# Patient Record
Sex: Female | Born: 1980 | Race: Black or African American | Hispanic: No | Marital: Single | Smoking: Never smoker
Health system: Southern US, Community
[De-identification: ages and names within clinical notes are randomized; demographics above are authoritative.]

---

## 2004-11-30 ENCOUNTER — Other Ambulatory Visit: Admission: RE | Admit: 2004-11-30 | Discharge: 2004-11-30 | Payer: Self-pay | Admitting: Obstetrics and Gynecology

## 2008-05-02 ENCOUNTER — Emergency Department (HOSPITAL_COMMUNITY): Admission: EM | Admit: 2008-05-02 | Discharge: 2008-05-02 | Payer: Self-pay | Admitting: Emergency Medicine

## 2008-12-05 ENCOUNTER — Encounter: Admission: RE | Admit: 2008-12-05 | Discharge: 2009-01-03 | Payer: Self-pay | Admitting: Obstetrics and Gynecology

## 2008-12-26 ENCOUNTER — Encounter: Admission: RE | Admit: 2008-12-26 | Discharge: 2008-12-26 | Payer: Self-pay | Admitting: Endocrinology

## 2009-08-04 IMAGING — US US SOFT TISSUE HEAD/NECK
1 series · 14 of 25 positions shown · non-contrast
Comparison: None

CLINICAL DATA: Enlarged thyroid on physical exam

THYROID ULTRASOUND
TECHNIQUE: Ultrasound examination of the thyroid gland and
adjacent soft tissues was performed.

[Series 1: us soft tissue head/neck · 0.11mm/px · 14 of 37 slices shown]
[im 1/37]
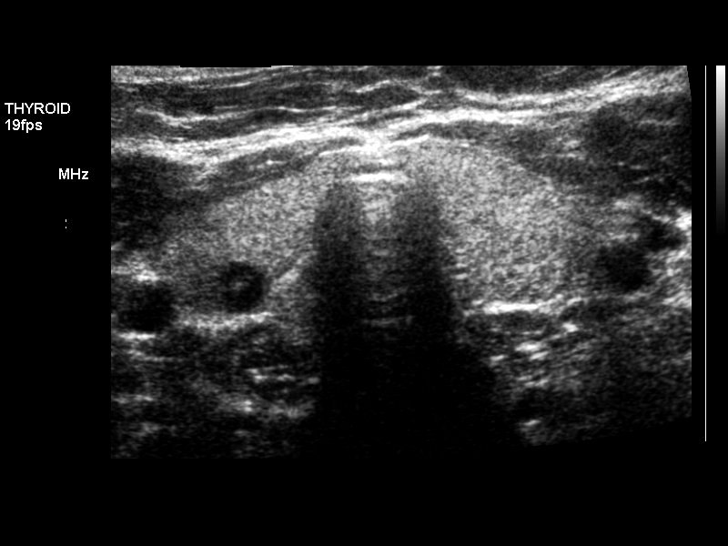
[im 4/37]
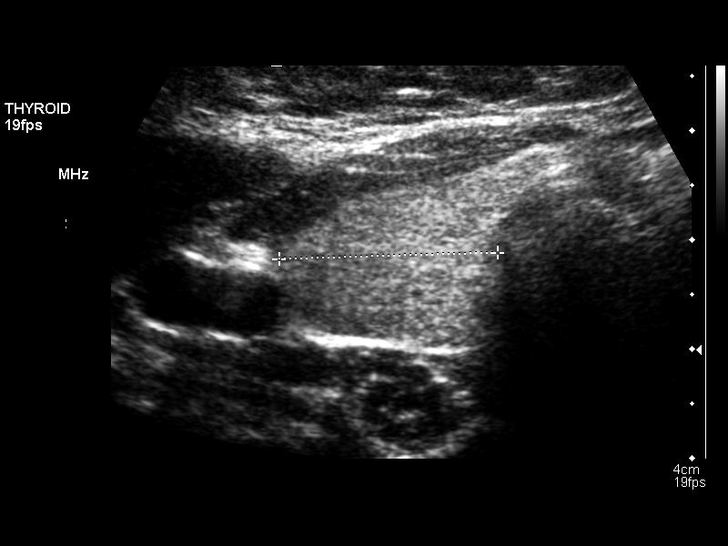
[im 7/37]
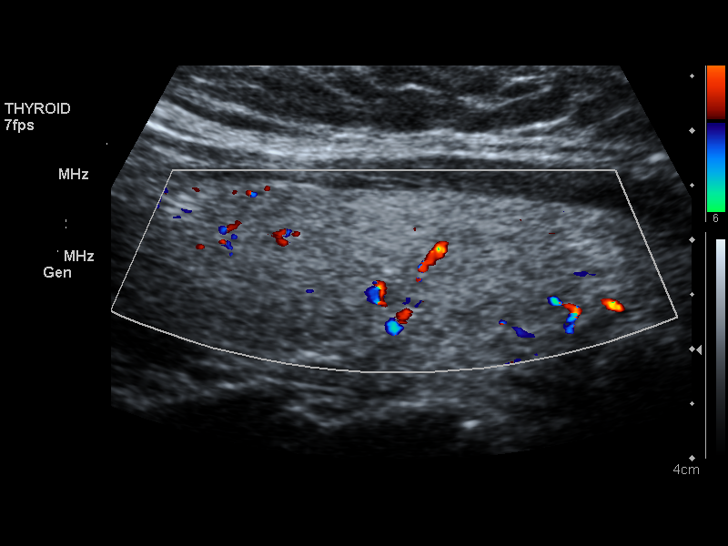
[im 10/37]
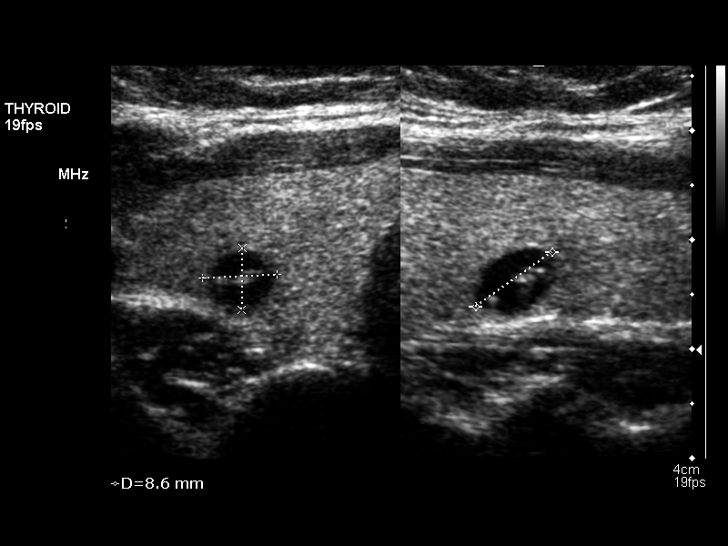
[im 13/37]
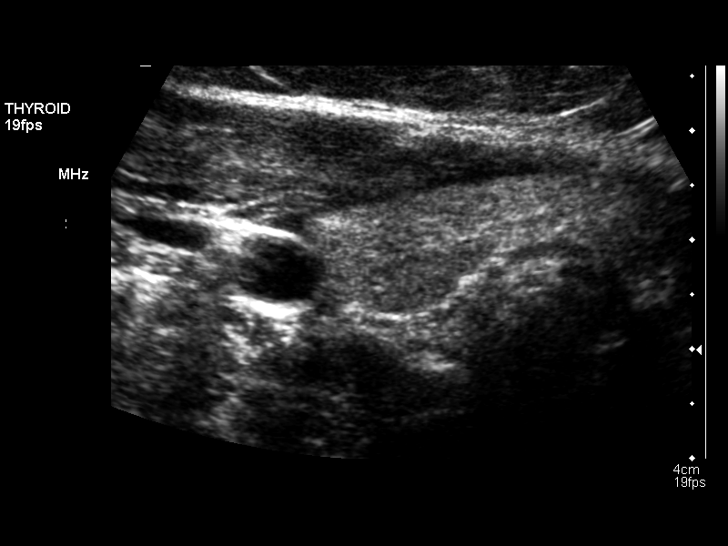
[im 14/37]
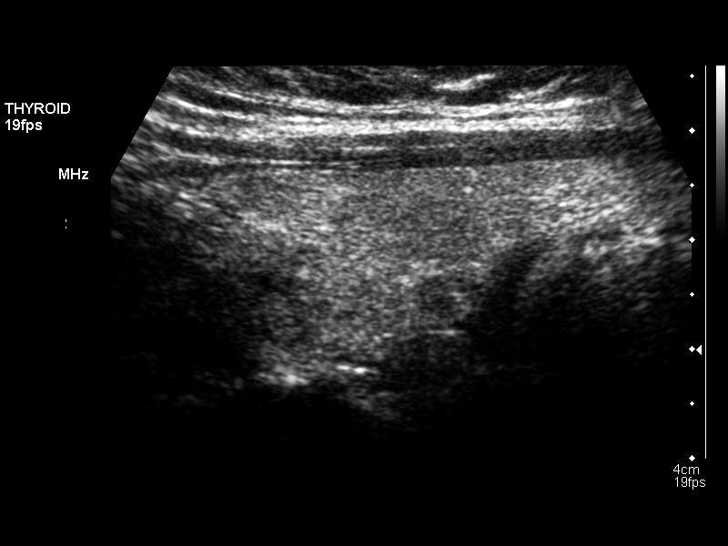
[im 17/37]
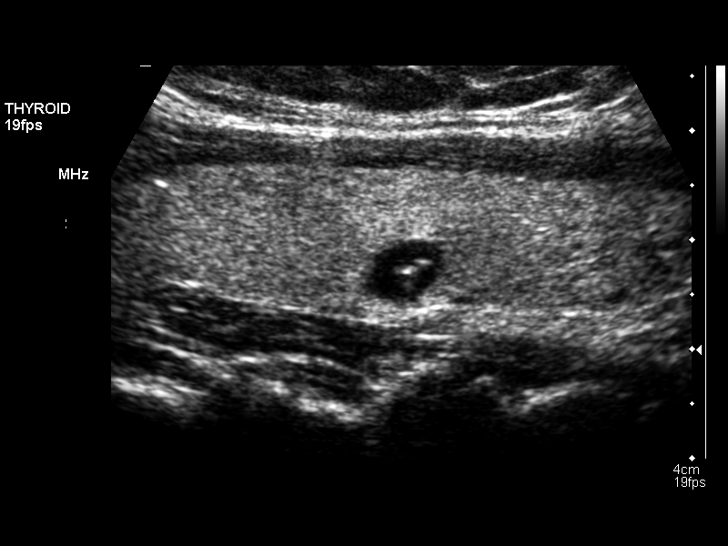
[im 20/37]
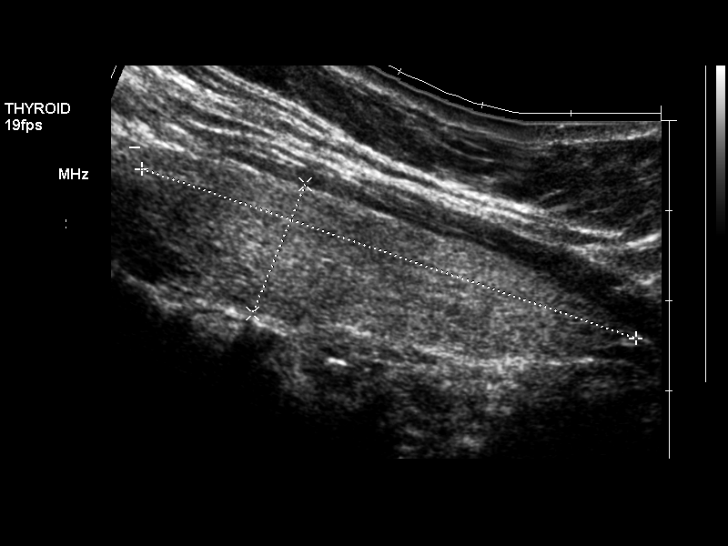
[im 23/37]
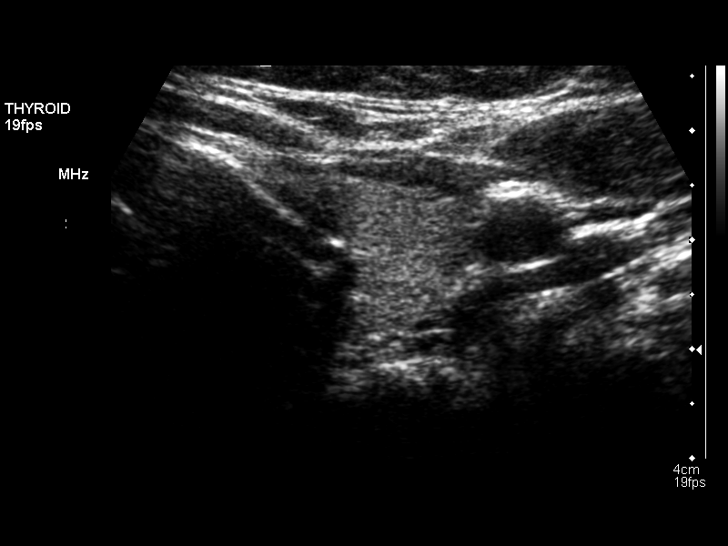
[im 25/37]
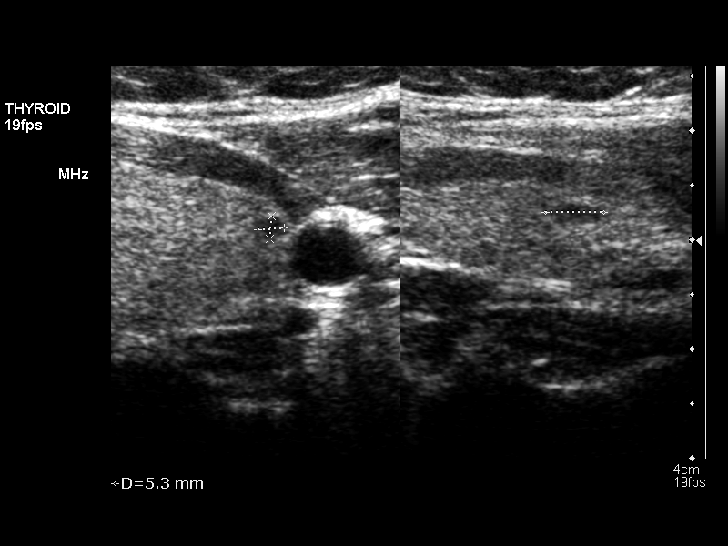
[im 28/37]
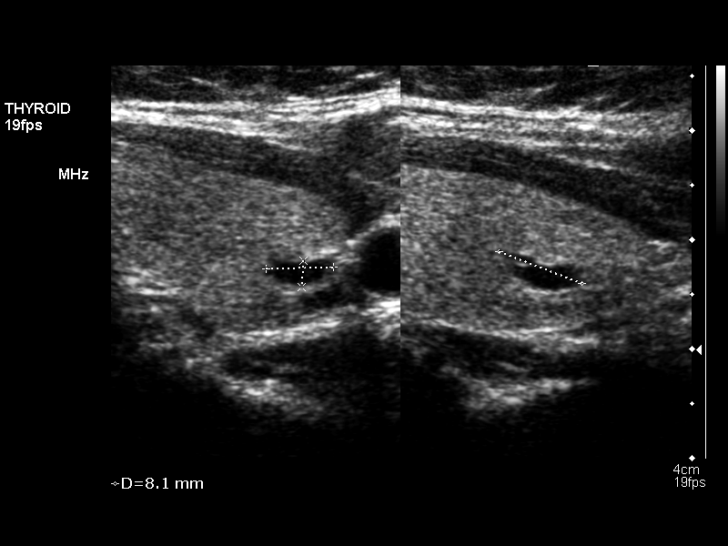
[im 31/37]
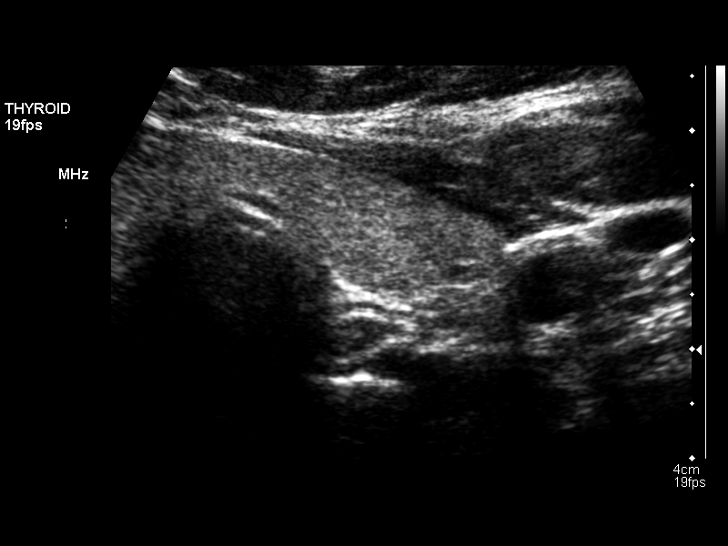
[im 34/37]
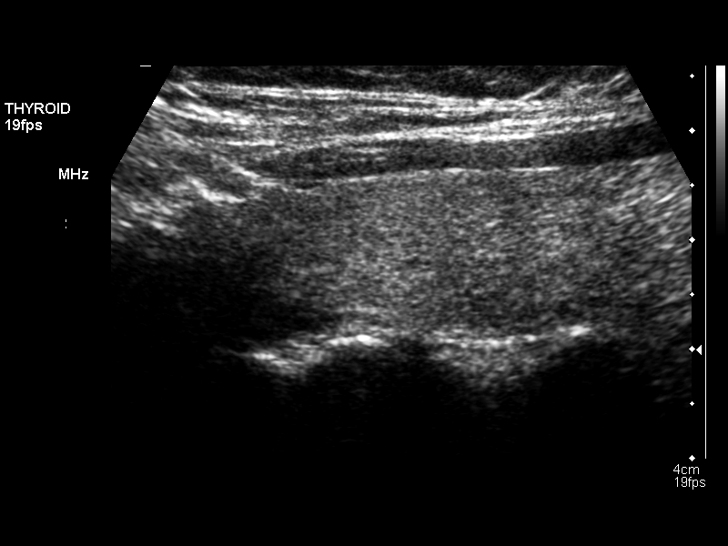
[im 37/37]
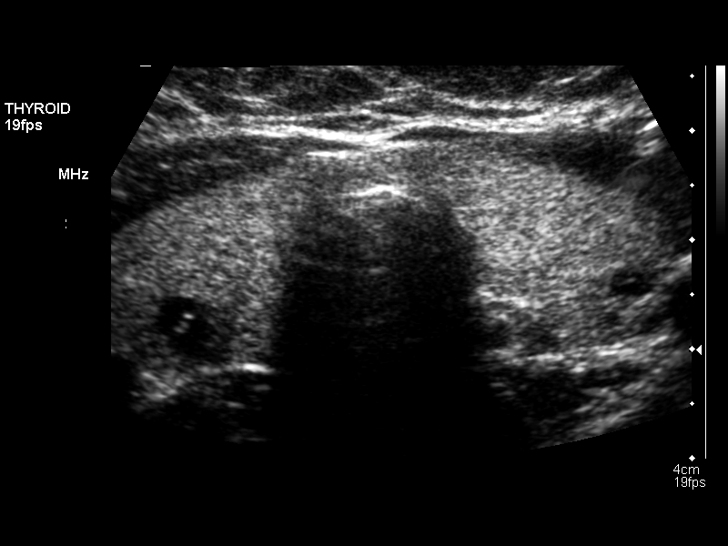

[14 of 25 positions shown; findings below may reference images not displayed]

FINDINGS: The thyroid gland is normal in size.  The right lobe
measures 5.9 cm sagittally, with a depth of 1.5 cm and width of
cm.  The left lobe measures 5.8 x 1.6 x 2.3 cm, with the isthmus
measuring 5 mm.  The largest nodule is hypoechoic with some
internal echoes in the periphery of the right mid lobe measuring 7
x 6 x 9 mm.  Two hypoechoic nodules are noted in the periphery of
the left mid and lower lobe of no more than 8 mm in diameter.
IMPRESSION: The thyroid gland is normal in size.  Small hypoechoic nodules are
noted bilaterally of no more than 9 mm in diameter.

## 2009-12-27 ENCOUNTER — Encounter: Admission: RE | Admit: 2009-12-27 | Discharge: 2009-12-27 | Payer: Self-pay | Admitting: Endocrinology

## 2010-08-05 IMAGING — US US SOFT TISSUE HEAD/NECK
1 series · 14 of 25 positions shown · non-contrast
Comparison: 12/26/2008

CLINICAL DATA: Follow-up thyroid nodule.

THYROID ULTRASOUND
TECHNIQUE: Ultrasound examination of the thyroid gland and
adjacent soft tissues was performed.

[Series 1: us soft tissue head/neck · 0.09mm/px · 14 of 41 slices shown]
[im 1/41]
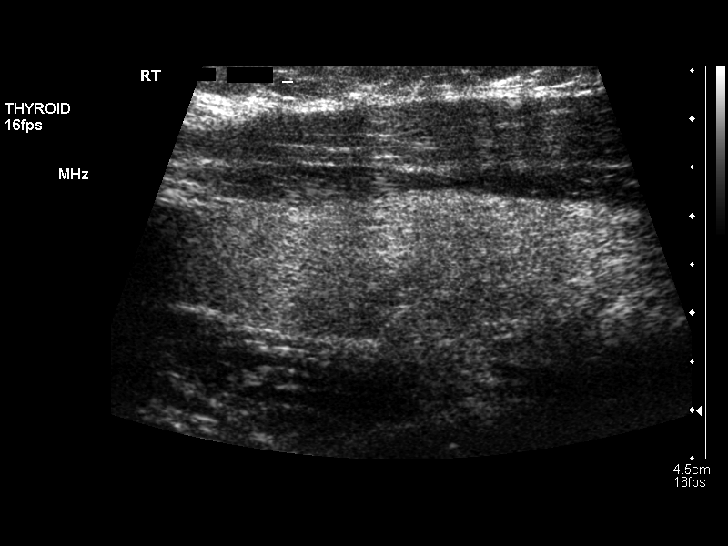
[im 4/41]
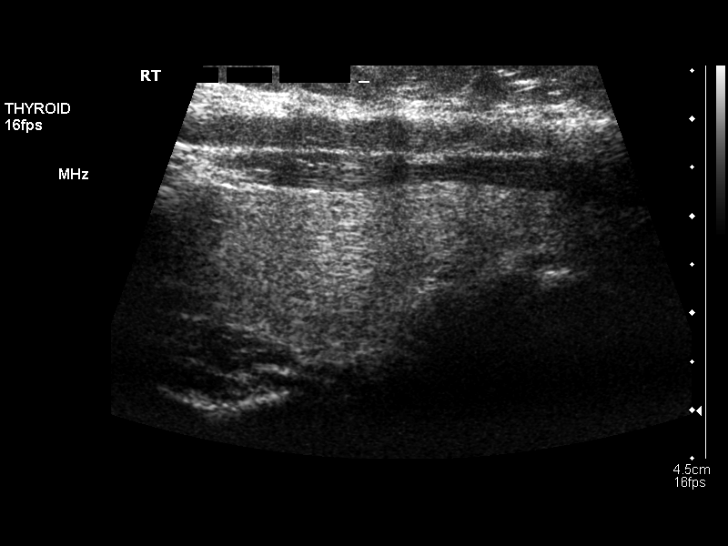
[im 7/41]
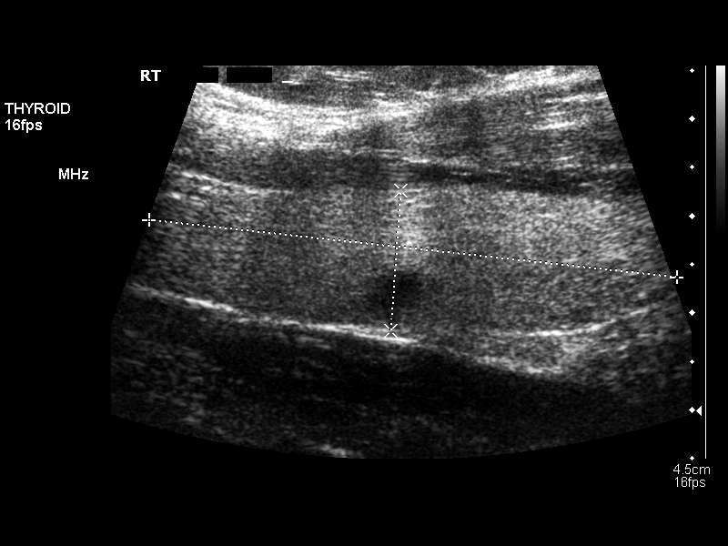
[im 11/41]
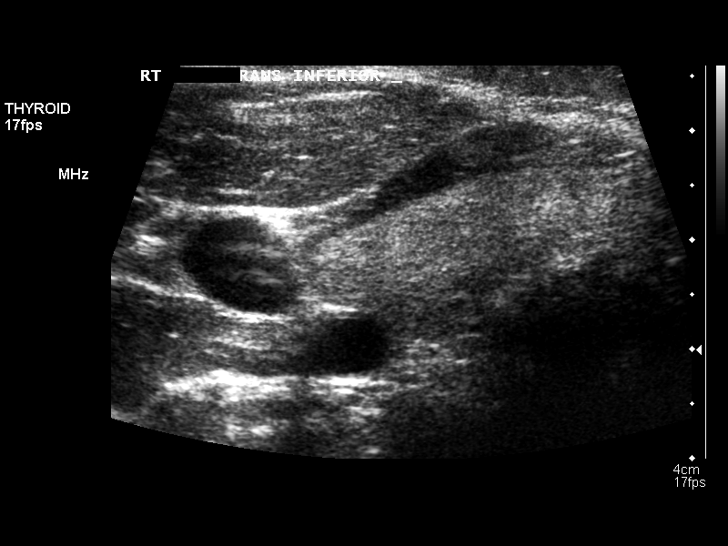
[im 14/41]
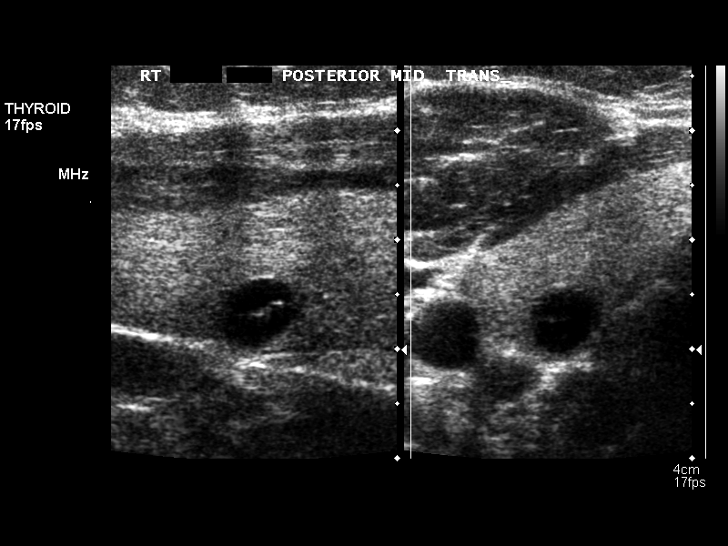
[im 16/41]
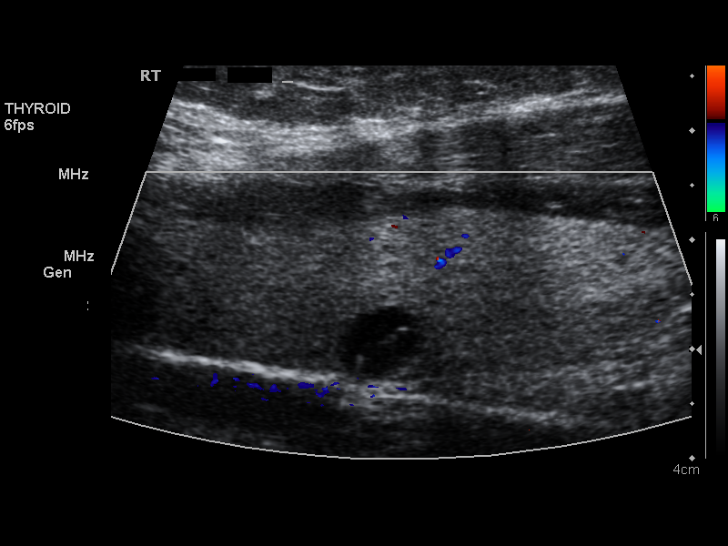
[im 19/41]
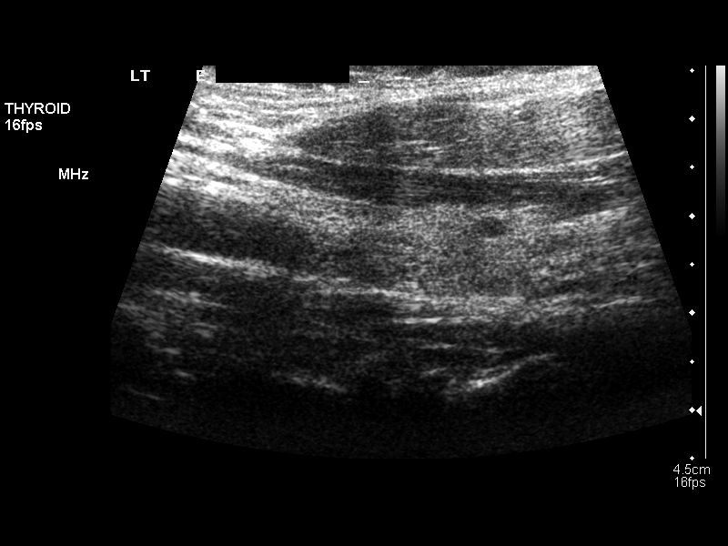
[im 22/41]
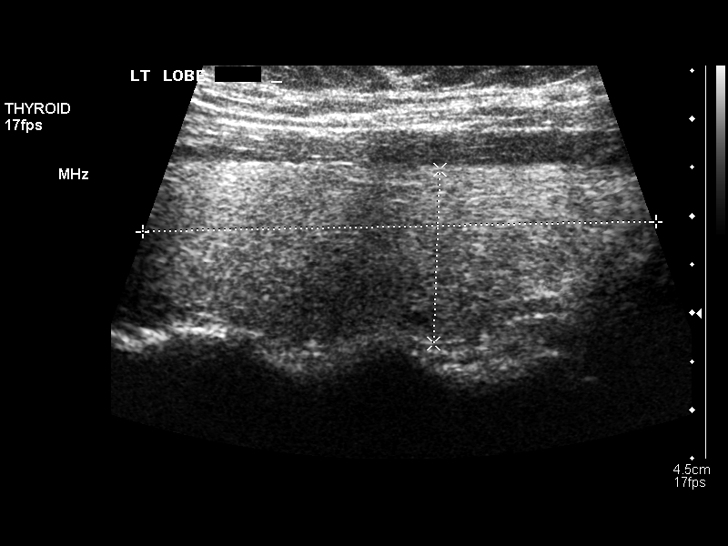
[im 26/41]
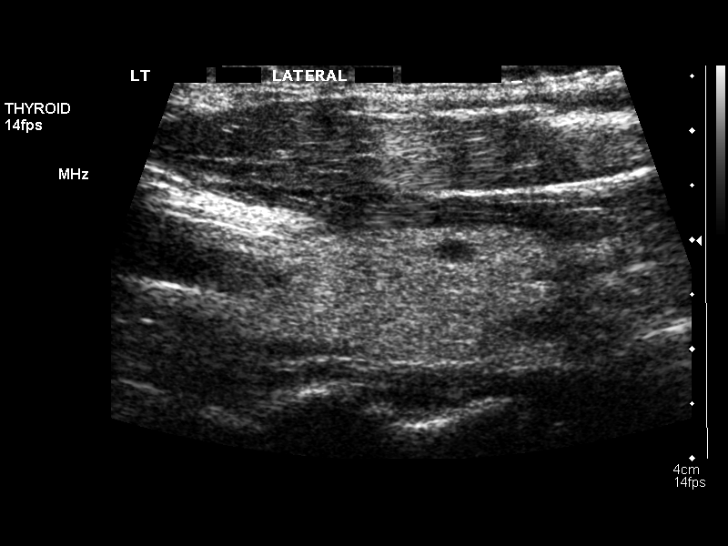
[im 27/41]
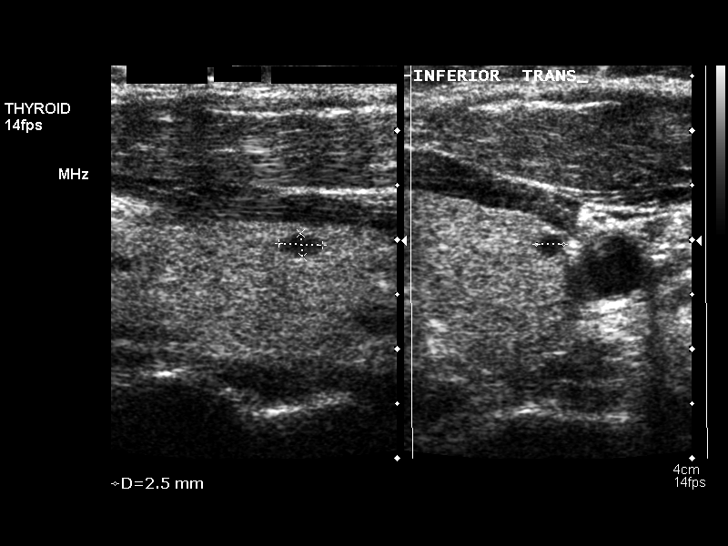
[im 31/41]
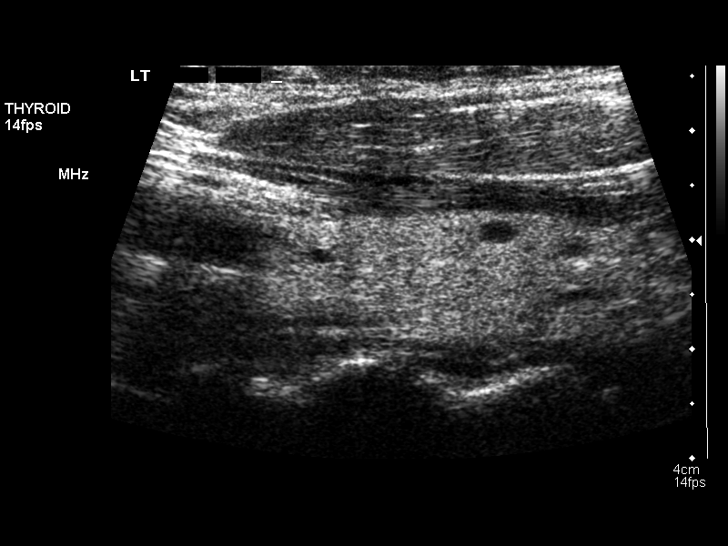
[im 34/41]
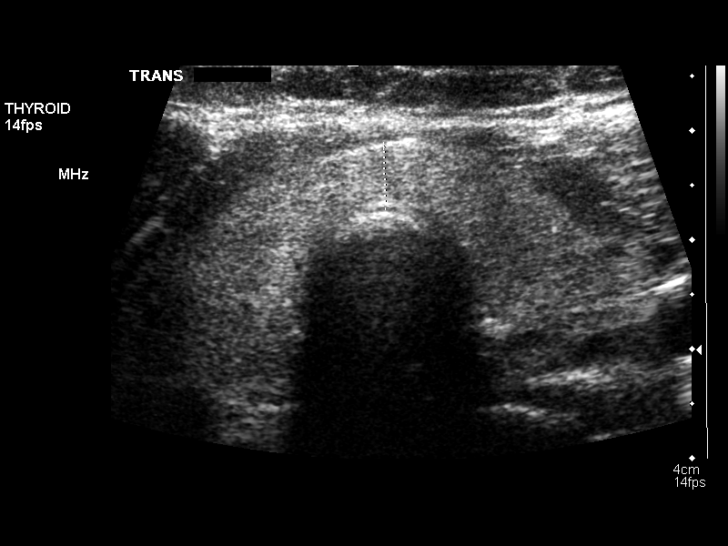
[im 37/41]
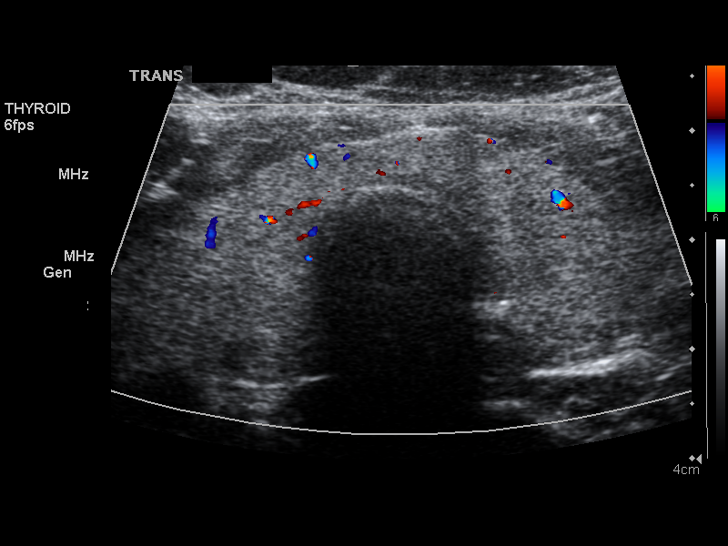
[im 41/41]
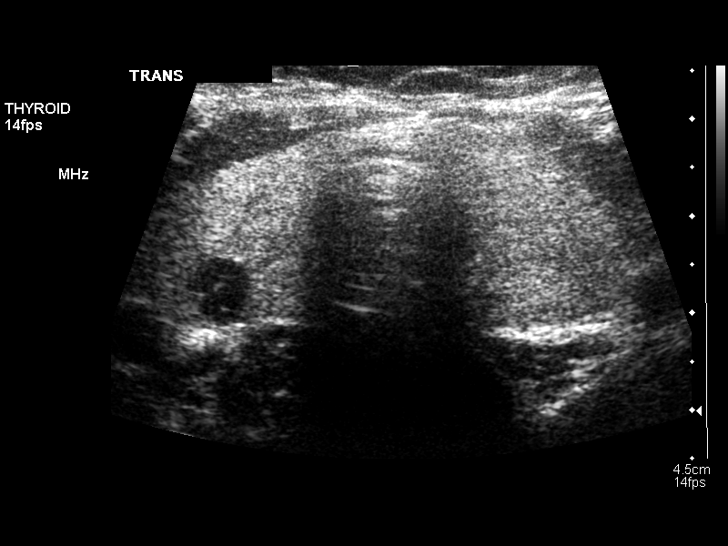

[14 of 25 positions shown; findings below may reference images not displayed]

FINDINGS: Right lobe of the thyroid measures 5.5 x 1.5 x 2.0 cm and
left lobe, 5.4 x 1.8 x 1.9 cm.  Isthmus measures 6 mm.  Thyroid
echotexture is homogeneous.  A cystic nodule in the right thyroid
measures 8 x 6 x 7 mm, stable.  Additional hypoechoic nodules
measure 4 mm or less in size.
IMPRESSION: No worrisome thyroid nodules.

## 2010-08-20 ENCOUNTER — Encounter: Admission: RE | Admit: 2010-08-20 | Payer: Self-pay | Admitting: Endocrinology

## 2010-10-13 ENCOUNTER — Encounter: Payer: Self-pay | Admitting: Endocrinology

## 2011-03-05 ENCOUNTER — Other Ambulatory Visit: Payer: Self-pay | Admitting: Endocrinology

## 2011-03-05 DIAGNOSIS — E049 Nontoxic goiter, unspecified: Secondary | ICD-10-CM

## 2011-03-11 ENCOUNTER — Ambulatory Visit
Admission: RE | Admit: 2011-03-11 | Discharge: 2011-03-11 | Disposition: A | Discharge status: Home / Self Care | Payer: BC Managed Care – PPO | Source: Ambulatory Visit | Attending: Endocrinology | Admitting: Endocrinology

## 2011-03-11 DIAGNOSIS — E049 Nontoxic goiter, unspecified: Secondary | ICD-10-CM

## 2011-04-09 ENCOUNTER — Other Ambulatory Visit: Payer: Self-pay | Admitting: Obstetrics and Gynecology

## 2011-04-09 DIAGNOSIS — N63 Unspecified lump in unspecified breast: Secondary | ICD-10-CM

## 2011-04-09 DIAGNOSIS — N644 Mastodynia: Secondary | ICD-10-CM

## 2011-04-11 ENCOUNTER — Ambulatory Visit
Admission: RE | Admit: 2011-04-11 | Discharge: 2011-04-11 | Disposition: A | Discharge status: Home / Self Care | Payer: BC Managed Care – PPO | Source: Ambulatory Visit | Attending: Obstetrics and Gynecology | Admitting: Obstetrics and Gynecology

## 2011-04-11 ENCOUNTER — Other Ambulatory Visit: Payer: Self-pay | Admitting: Obstetrics and Gynecology

## 2011-04-11 DIAGNOSIS — N644 Mastodynia: Secondary | ICD-10-CM

## 2011-04-11 DIAGNOSIS — N63 Unspecified lump in unspecified breast: Secondary | ICD-10-CM

## 2014-12-19 ENCOUNTER — Emergency Department (HOSPITAL_COMMUNITY)
Admission: EM | Admit: 2014-12-19 | Discharge: 2014-12-19 | Disposition: A | Discharge status: Home / Self Care | Payer: BC Managed Care – PPO | Attending: Emergency Medicine | Admitting: Emergency Medicine

## 2014-12-19 ENCOUNTER — Encounter (HOSPITAL_COMMUNITY): Payer: Self-pay

## 2014-12-19 DIAGNOSIS — R1084 Generalized abdominal pain: Secondary | ICD-10-CM

## 2014-12-19 DIAGNOSIS — R112 Nausea with vomiting, unspecified: Secondary | ICD-10-CM

## 2014-12-19 DIAGNOSIS — Z3202 Encounter for pregnancy test, result negative: Secondary | ICD-10-CM | POA: Insufficient documentation

## 2014-12-19 DIAGNOSIS — R197 Diarrhea, unspecified: Secondary | ICD-10-CM | POA: Insufficient documentation

## 2014-12-19 LAB — COMPREHENSIVE METABOLIC PANEL
ALBUMIN: 4 g/dL (ref 3.5–5.2)
ALK PHOS: 54 U/L (ref 39–117)
ALT: 18 U/L (ref 0–35)
AST: 25 U/L (ref 0–37)
Anion gap: 9 (ref 5–15)
BILIRUBIN TOTAL: 0.4 mg/dL (ref 0.3–1.2)
BUN: 10 mg/dL (ref 6–23)
CHLORIDE: 107 mmol/L (ref 96–112)
CO2: 24 mmol/L (ref 19–32)
CREATININE: 0.73 mg/dL (ref 0.50–1.10)
Calcium: 9.1 mg/dL (ref 8.4–10.5)
GFR calc Af Amer: >90 mL/min (ref >90)
GFR calc non Af Amer: >90 mL/min (ref >90)
Glucose, Bld: 210 mg/dL — ABNORMAL HIGH (ref 70–99)
POTASSIUM: 3.9 mmol/L (ref 3.5–5.1)
Sodium: 140 mmol/L (ref 135–145)
Total Protein: 7.6 g/dL (ref 6.0–8.3)

## 2014-12-19 LAB — CBC WITH DIFFERENTIAL/PLATELET
BASOS ABS: 0 10*3/uL (ref 0.0–0.1)
BASOS PCT: 0 % (ref 0–1)
Eosinophils Absolute: 0 10*3/uL (ref 0.0–0.7)
Eosinophils Relative: 0 % (ref 0–5)
HEMATOCRIT: 37.4 % (ref 36.0–46.0)
Hemoglobin: 12.6 g/dL (ref 12.0–15.0)
LYMPHS PCT: 10 % — AB (ref 12–46)
Lymphs Abs: 0.7 10*3/uL (ref 0.7–4.0)
MCH: 26.8 pg (ref 26.0–34.0)
MCHC: 33.7 g/dL (ref 30.0–36.0)
MCV: 79.6 fL (ref 78.0–100.0)
MONO ABS: 0.2 10*3/uL (ref 0.1–1.0)
Monocytes Relative: 2 % — ABNORMAL LOW (ref 3–12)
NEUTROS ABS: 6.1 10*3/uL (ref 1.7–7.7)
Neutrophils Relative %: 88 % — ABNORMAL HIGH (ref 43–77)
PLATELETS: 408 10*3/uL — AB (ref 150–400)
RBC: 4.7 MIL/uL (ref 3.87–5.11)
RDW: 12.5 % (ref 11.5–15.5)
WBC: 7 10*3/uL (ref 4.0–10.5)

## 2014-12-19 LAB — URINALYSIS, ROUTINE W REFLEX MICROSCOPIC
Bilirubin Urine: NEGATIVE
Glucose, UA: 100 mg/dL — AB
Ketones, ur: NEGATIVE mg/dL
LEUKOCYTES UA: NEGATIVE
NITRITE: NEGATIVE
PROTEIN: NEGATIVE mg/dL
Specific Gravity, Urine: 1.029 (ref 1.005–1.030)
UROBILINOGEN UA: 1 mg/dL (ref 0.0–1.0)
pH: 6 (ref 5.0–8.0)

## 2014-12-19 LAB — URINE MICROSCOPIC-ADD ON

## 2014-12-19 LAB — LIPASE, BLOOD: LIPASE: 27 U/L (ref 11–59)

## 2014-12-19 LAB — POC URINE PREG, ED: Preg Test, Ur: NEGATIVE

## 2014-12-19 MED ORDER — SODIUM CHLORIDE 0.9 % IV BOLUS (SEPSIS)
1000.0000 mL | Freq: Once | INTRAVENOUS | Status: AC
  Administered 2014-12-19: 1000 mL via INTRAVENOUS

## 2014-12-19 MED ORDER — KETOROLAC TROMETHAMINE 30 MG/ML IJ SOLN
30.0000 mg | Freq: Once | INTRAMUSCULAR | Status: AC
  Administered 2014-12-19: 30 mg via INTRAVENOUS
  Filled 2014-12-19: qty 1

## 2014-12-19 MED ORDER — MORPHINE SULFATE 4 MG/ML IJ SOLN
4.0000 mg | Freq: Once | INTRAMUSCULAR | Status: AC
  Administered 2014-12-19: 4 mg via INTRAVENOUS
  Filled 2014-12-19: qty 1

## 2014-12-19 MED ORDER — HYDROCODONE-ACETAMINOPHEN 5-325 MG PO TABS: 1.0000 | ORAL_TABLET | ORAL | Status: AC | PRN

## 2014-12-19 MED ORDER — ONDANSETRON HCL 4 MG PO TABS: 4.0000 mg | ORAL_TABLET | Freq: Four times a day (QID) | ORAL | Status: AC

## 2014-12-19 MED ORDER — ONDANSETRON HCL 4 MG/2ML IJ SOLN
4.0000 mg | Freq: Once | INTRAMUSCULAR | Status: AC
  Administered 2014-12-19: 4 mg via INTRAVENOUS
  Filled 2014-12-19: qty 2

## 2014-12-19 NOTE — ED Provider Notes (Signed)
CSN: [id number]     Arrival date & time 12/19/14  1014 History   First MD Initiated Contact with Patient 12/19/14 1208     Chief Complaint  Patient presents with  . Abdominal Pain     (Consider location/radiation/quality/duration/timing/severity/associated sxs/prior Treatment) HPI  Lori Solis is a 34 y.o. female presenting with sharp stabbing diffuse abdominal pain that started at 4:30 AM this morning. She denies alleviating or aggravating factors and has not taken anything for her discomfort. She also reports associated nausea and vomiting with 7 episodes of emesis that is nonbloody nonbilious. Patient also reports 3-4 episodes of runny stool is not melanotic and no hematochezia. She denies antibiotic use. No fevers or chills. Patient reports decreased eating. She denies any urinary symptoms or pelvic complaints. Patient currently on her menses but states this does not feel like menses pain. No abdominal surgeries.   No past medical history on file. No past surgical history on file. No family history on file. History  Substance Use Topics  . Smoking status: Never Smoker   . Smokeless tobacco: Not on file  . Alcohol Use: No   OB History    No data available     Review of Systems 10 Systems reviewed and are negative for acute change except as noted in the HPI.    Allergies  Review of patient's allergies indicates not on file.  Home Medications   Prior to Admission medications   Medication Sig Start Date End Date Taking? Authorizing Provider  HYDROcodone-acetaminophen (NORCO/VICODIN) 5-325 MG per tablet Take 1-2 tablets by mouth every 4 (four) hours as needed. 12/19/14   Oswaldo ConroyVictoria Lorraina Spring, PA-C  ondansetron (ZOFRAN) 4 MG tablet Take 1 tablet (4 mg total) by mouth every 6 (six) hours. 12/19/14   Oswaldo ConroyVictoria Ulmer Degen, PA-C   BP 127/79 mmHg  Pulse 71  Temp(Src) 98.3 F (36.8 C)  Resp 16  Ht 5\' 6"  (1.676 m)  Wt 230 lb (104.327 kg)  BMI 37.14 kg/m2  SpO2 100% Physical Exam   Constitutional: She appears well-developed and well-nourished. No distress.  HENT:  Head: Normocephalic and atraumatic.  Dry mucous membranes.  Eyes: Conjunctivae and EOM are normal. Right eye exhibits no discharge. Left eye exhibits no discharge.  Cardiovascular: Normal rate and regular rhythm.   Pulmonary/Chest: Effort normal and breath sounds normal. No respiratory distress. She has no wheezes.  Abdominal: Soft. Bowel sounds are normal. She exhibits no distension.  Mild diffuse abdominal tenderness without rebound, rigidity, guarding. No CVA tenderness.  Neurological: She is alert. She exhibits normal muscle tone. Coordination normal.  Skin: Skin is warm and dry. She is not diaphoretic.  Nursing note and vitals reviewed.   ED Course  Procedures (including critical care time) Labs Review Labs Reviewed  COMPREHENSIVE METABOLIC PANEL - Abnormal; Notable for the following:    Glucose, Bld 210 (*)    All other components within normal limits  CBC WITH DIFFERENTIAL/PLATELET - Abnormal; Notable for the following:    Platelets 408 (*)    Neutrophils Relative % 88 (*)    Lymphocytes Relative 10 (*)    Monocytes Relative 2 (*)    All other components within normal limits  URINALYSIS, ROUTINE W REFLEX MICROSCOPIC - Abnormal; Notable for the following:    Glucose, UA 100 (*)    Hgb urine dipstick MODERATE (*)    All other components within normal limits  URINE MICROSCOPIC-ADD ON - Abnormal; Notable for the following:    Squamous Epithelial / LPF FEW (*)  Bacteria, UA FEW (*)    All other components within normal limits  LIPASE, BLOOD  POC URINE PREG, ED    Imaging Review No results found.   EKG Interpretation None      MDM   Final diagnoses:  Generalized abdominal pain  Nausea vomiting and diarrhea   Patient presenting with one-day history of diffuse abdominal pain as well as nausea, vomiting, diarrhea. VSS. Mild diffuse abdominal tenderness without evidence of  peritonitis. Labwork reassuring. No leukocytosis. UA with hemoglobin the patient on her period. No evidence of infection. Discussed patient's elevated glucose recommended follow-up with PCP. Patient's abdominal pain resolved with medications and she is tolerating fluids without difficulties. Patient given prescription for Zofran as well as short prescription of hydrocodone. Driving and sedation precautions provided. Pt to follow up with [facility].  Discussed return precautions with patient. Discussed all results and patient verbalizes understanding and agrees with plan.   Oswaldo Conroy, PA-C 12/20/14 0730  Zadie Rhine, MD 12/21/14 [phone]

## 2014-12-19 NOTE — ED Notes (Signed)
Ginger ale given to pt.  

## 2014-12-19 NOTE — ED Notes (Signed)
Pt here for abd pain and vomiting since 0430, norelief with ginger ale or crackers.

## 2014-12-19 NOTE — Discharge Instructions (Signed)
Return to the emergency room with worsening of symptoms, new symptoms or with symptoms that are concerning , especially fevers, abdominal pain in one area, unable to keep down fluids, blood in stool or vomit, severe pain, you feel faint, lightheaded or pass out. zofran for nausea. norco for severe pain. Do not operate machinery, drive or drink alcohol while taking narcotics or muscle relaxers. BRAT diet: bananas, rice, applesauce, toast Follow up with the wellness center in 2 days for recheck. Call to make appointment or go to walk in clinic on Tuesday or Wednesday. Read below information and follow recommendations.  Viral Gastroenteritis Viral gastroenteritis is also known as stomach flu. This condition affects the stomach and intestinal tract. It can cause sudden diarrhea and vomiting. The illness typically lasts 3 to 8 days. Most people develop an immune response that eventually gets rid of the virus. While this natural response develops, the virus can make you quite ill. CAUSES  Many different viruses can cause gastroenteritis, such as rotavirus or noroviruses. You can catch one of these viruses by consuming contaminated food or water. You may also catch a virus by sharing utensils or other personal items with an infected person or by touching a contaminated surface. SYMPTOMS  The most common symptoms are diarrhea and vomiting. These problems can cause a severe loss of body fluids (dehydration) and a body salt (electrolyte) imbalance. Other symptoms may include:  Fever.  Headache.  Fatigue.  Abdominal pain. DIAGNOSIS  Your caregiver can usually diagnose viral gastroenteritis based on your symptoms and a physical exam. A stool sample may also be taken to test for the presence of viruses or other infections. TREATMENT  This illness typically goes away on its own. Treatments are aimed at rehydration. The most serious cases of viral gastroenteritis involve vomiting so severely that you are  not able to keep fluids down. In these cases, fluids must be given through an intravenous line (IV). HOME CARE INSTRUCTIONS   Drink enough fluids to keep your urine clear or pale yellow. Drink small amounts of fluids frequently and increase the amounts as tolerated.  Ask your caregiver for specific rehydration instructions.  Avoid:  Foods high in sugar.  Alcohol.  Carbonated drinks.  Tobacco.  Juice.  Caffeine drinks.  Extremely hot or cold fluids.  Fatty, greasy foods.  Too much intake of anything at one time.  Dairy products until 24 to 48 hours after diarrhea stops.  You may consume probiotics. Probiotics are active cultures of beneficial bacteria. They may lessen the amount and number of diarrheal stools in adults. Probiotics can be found in yogurt with active cultures and in supplements.  Wash your hands well to avoid spreading the virus.  Only take over-the-counter or prescription medicines for pain, discomfort, or fever as directed by your caregiver. Do not give aspirin to children. Antidiarrheal medicines are not recommended.  Ask your caregiver if you should continue to take your regular prescribed and over-the-counter medicines.  Keep all follow-up appointments as directed by your caregiver. SEEK IMMEDIATE MEDICAL CARE IF:   You are unable to keep fluids down.  You do not urinate at least once every 6 to 8 hours.  You develop shortness of breath.  You notice blood in your stool or vomit. This may look like coffee grounds.  You have abdominal pain that increases or is concentrated in one small area (localized).  You have persistent vomiting or diarrhea.  You have a fever.  The patient is a child younger than  3 months, and he or she has a fever.  The patient is a child older than 3 months, and he or she has a fever and persistent symptoms.  The patient is a child older than 3 months, and he or she has a fever and symptoms suddenly get worse.  The  patient is a baby, and he or she has no tears when crying. MAKE SURE YOU:   Understand these instructions.  Will watch your condition.  Will get help right away if you are not doing well or get worse. Document Released: 09/09/2005 Document Revised: 12/02/2011 Document Reviewed: 06/26/2011 [facility] Patient Information 2015 [place], [place]. This information is not intended to replace advice given to you by your health care provider. Make sure you discuss any questions you have with your health care provider.

## 2014-12-19 NOTE — ED Notes (Signed)
Pt reports 3-4 episodes of "runny" stool today. Per pt, it is "yellow in color like that stuff I'm throwing up." Pt denies being on antibiotics recently. States she began vomiting this morning at 4:30 am. She reports generalized abdominal pain, states it is tender on palpation but appears to be in NAD. Speaking in clear sentences with eyes shut and resting.
